# Patient Record
Sex: Female | Born: 1990 | Race: Black or African American | Hispanic: No | Marital: Single | Smoking: Never smoker
Health system: Southern US, Community
[De-identification: ages and names within clinical notes are randomized; demographics above are authoritative.]

## PROBLEM LIST (undated history)

## (undated) DIAGNOSIS — H6690 Otitis media, unspecified, unspecified ear: Secondary | ICD-10-CM

---
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
## 2010-04-03 ENCOUNTER — Emergency Department: Payer: Self-pay | Admitting: Emergency Medicine

## 2010-04-03 ENCOUNTER — Ambulatory Visit: Payer: Self-pay | Admitting: Family Medicine

## 2010-04-06 ENCOUNTER — Encounter: Payer: Self-pay | Admitting: Family Medicine

## 2010-08-10 ENCOUNTER — Ambulatory Visit: Payer: Self-pay | Admitting: Family Medicine

## 2010-09-21 ENCOUNTER — Ambulatory Visit: Payer: Self-pay | Admitting: Family Medicine

## 2010-09-28 ENCOUNTER — Ambulatory Visit: Payer: Self-pay | Admitting: Family Medicine

## 2011-03-18 ENCOUNTER — Ambulatory Visit: Payer: Self-pay | Admitting: Family Medicine

## 2011-07-12 ENCOUNTER — Ambulatory Visit: Payer: Self-pay | Admitting: Family Medicine

## 2011-07-27 ENCOUNTER — Ambulatory Visit: Payer: Self-pay | Admitting: Family Medicine

## 2011-08-20 ENCOUNTER — Ambulatory Visit: Payer: Self-pay | Admitting: Family Medicine

## 2011-09-14 ENCOUNTER — Ambulatory Visit: Payer: Self-pay | Admitting: Family Medicine

## 2011-11-05 ENCOUNTER — Ambulatory Visit: Payer: Self-pay | Admitting: Family Medicine

## 2012-09-29 IMAGING — CR DG SHOULDER 3+V*R*
1 series · 4 of 4 positions shown · non-contrast
Comparison: none

REASON FOR EXAM: chronic rt shoulder pain fax report to [DATE] Dr
Ilene Tobias
COMMENTS:

PROCEDURE:     DXR - DXR SHOULDER RIGHT COMPLETE  - July 12, 2011 [DATE]
RESULT:     No fracture, dislocation or other acute bony abnormality is
identified. No calcification about the humeral head is seen.

[Series 1: internal rotate · 0.17mm/px · 4 of 4 slices shown]
[im 1/4]
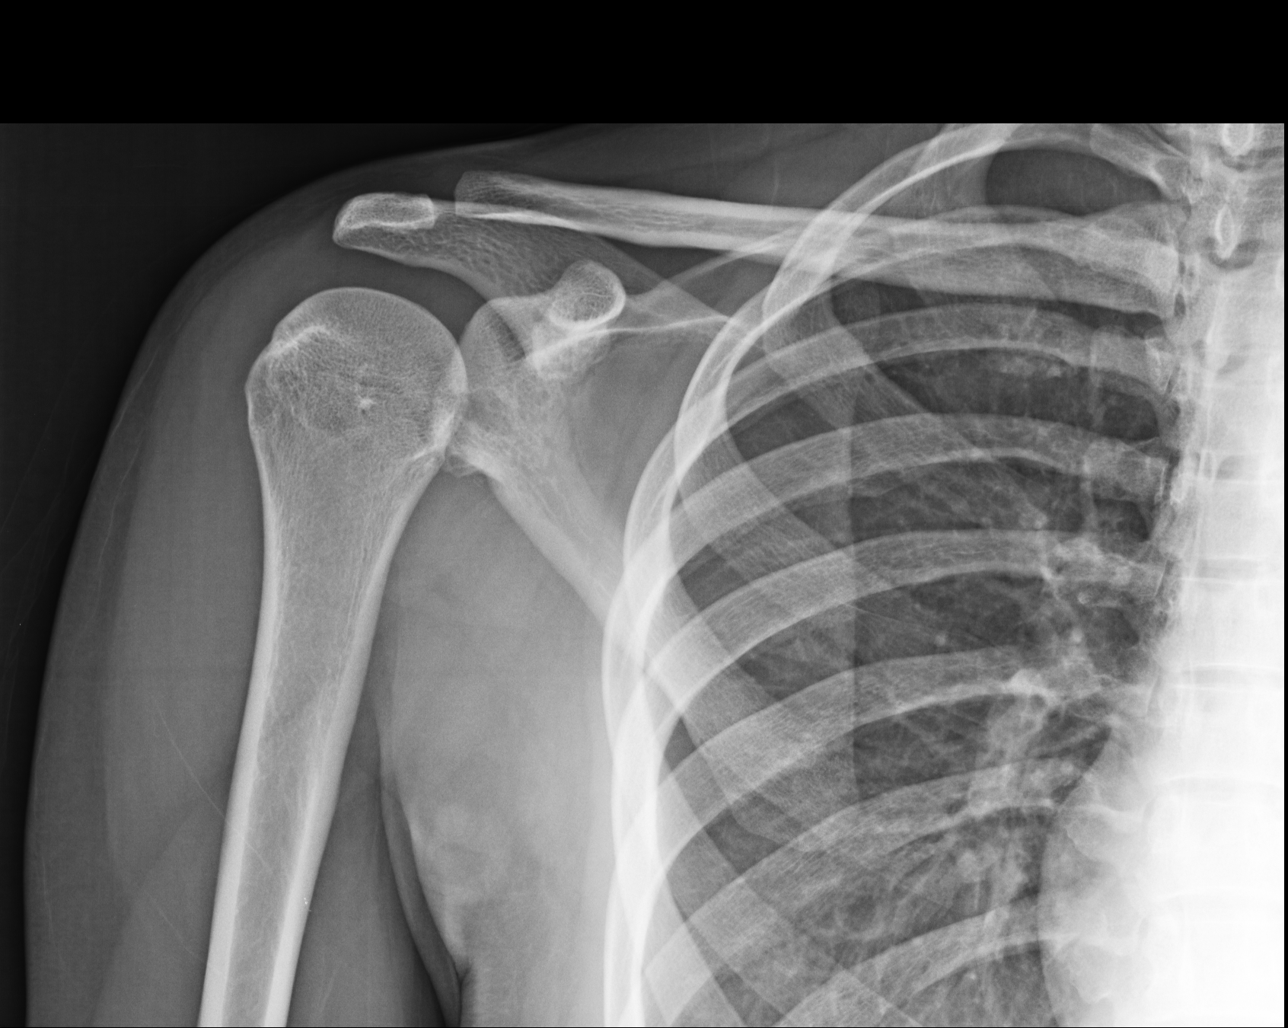
[im 2/4]
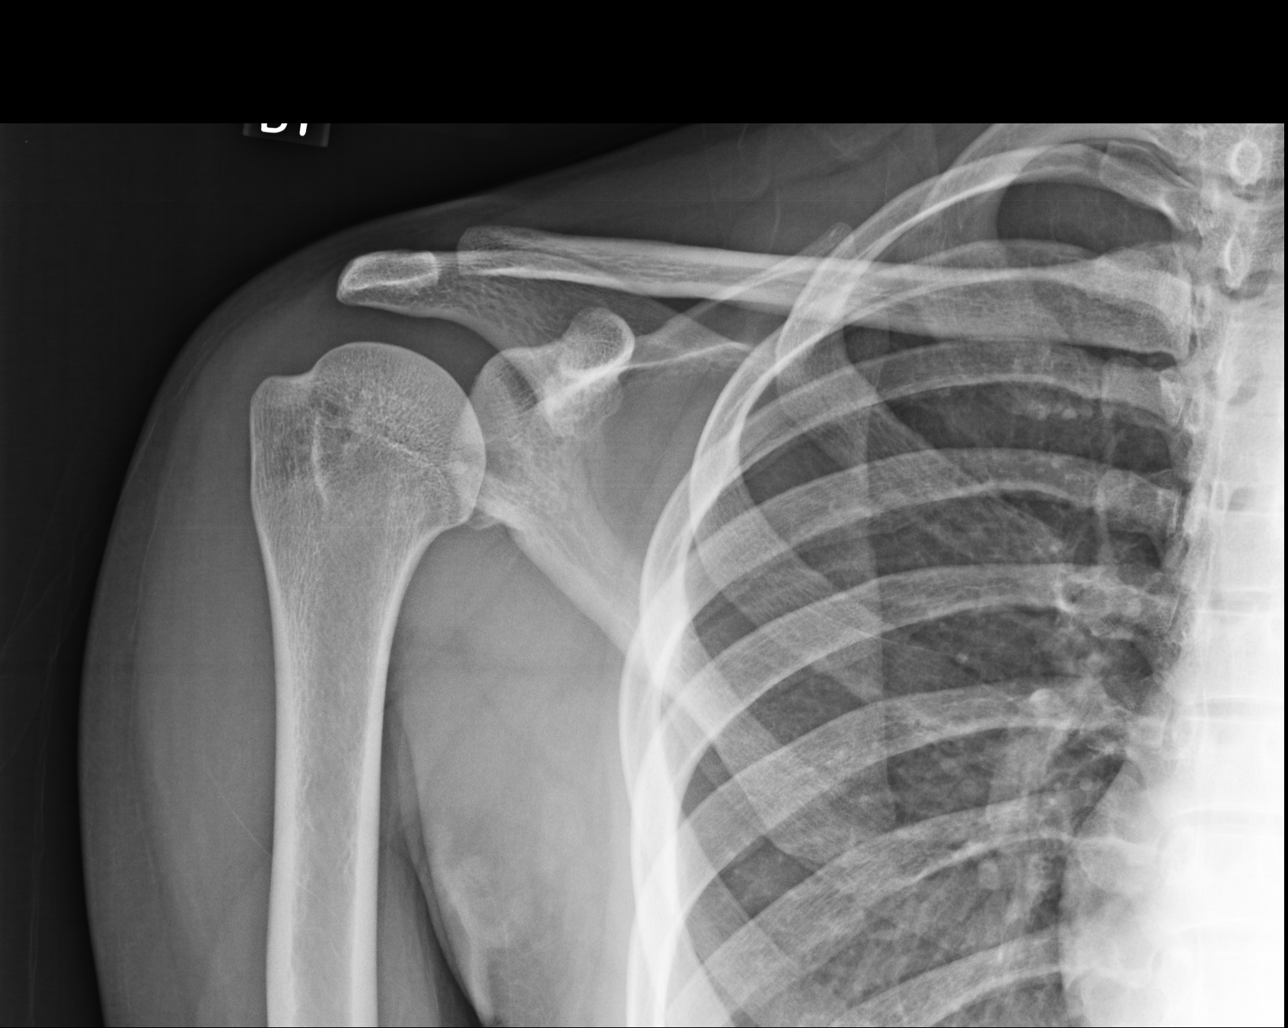
[im 3/4]
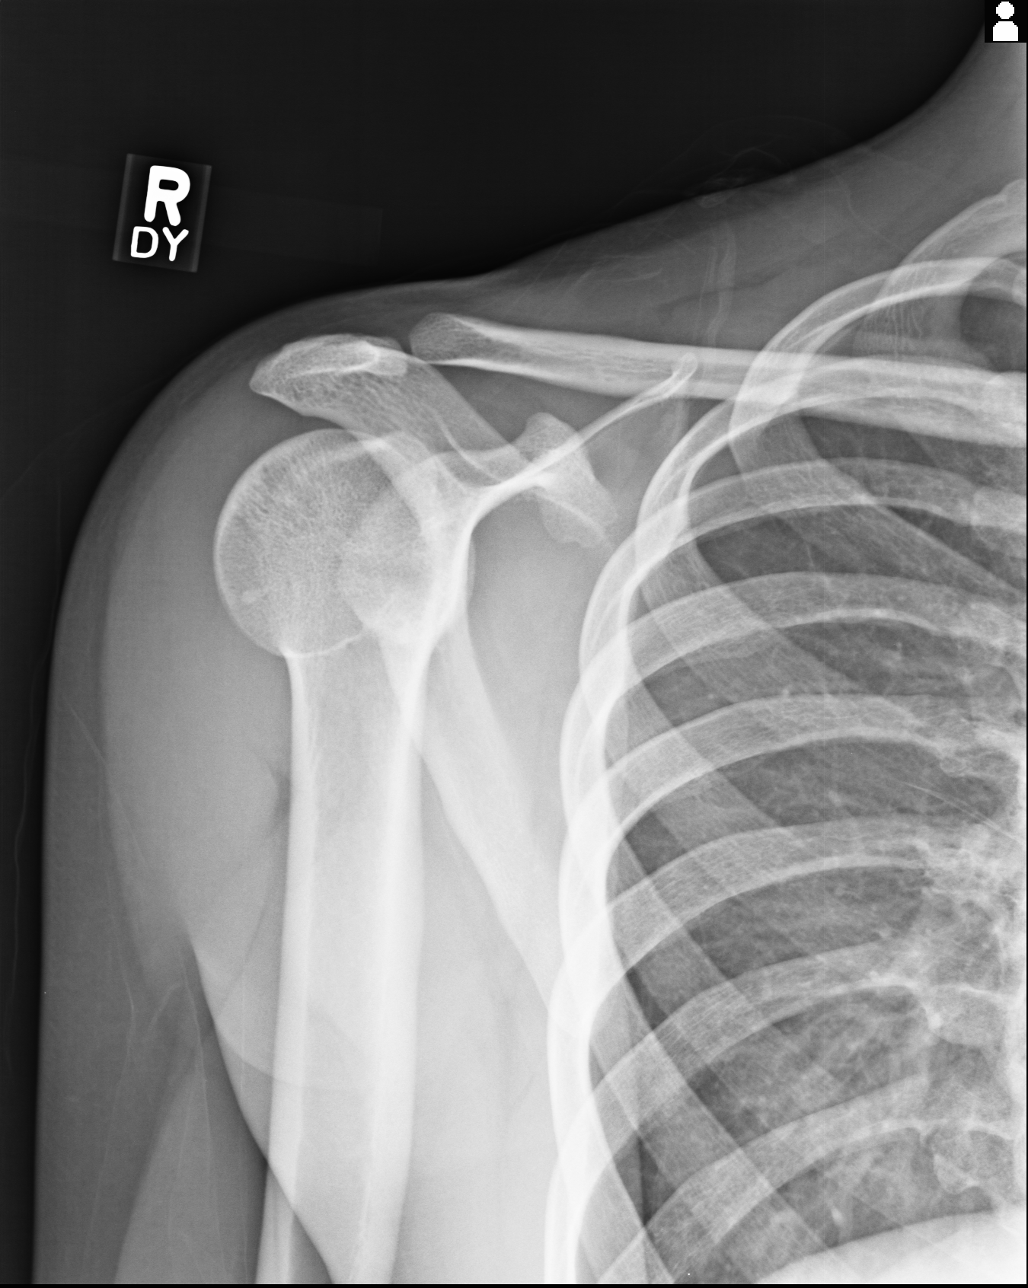
[im 4/4]
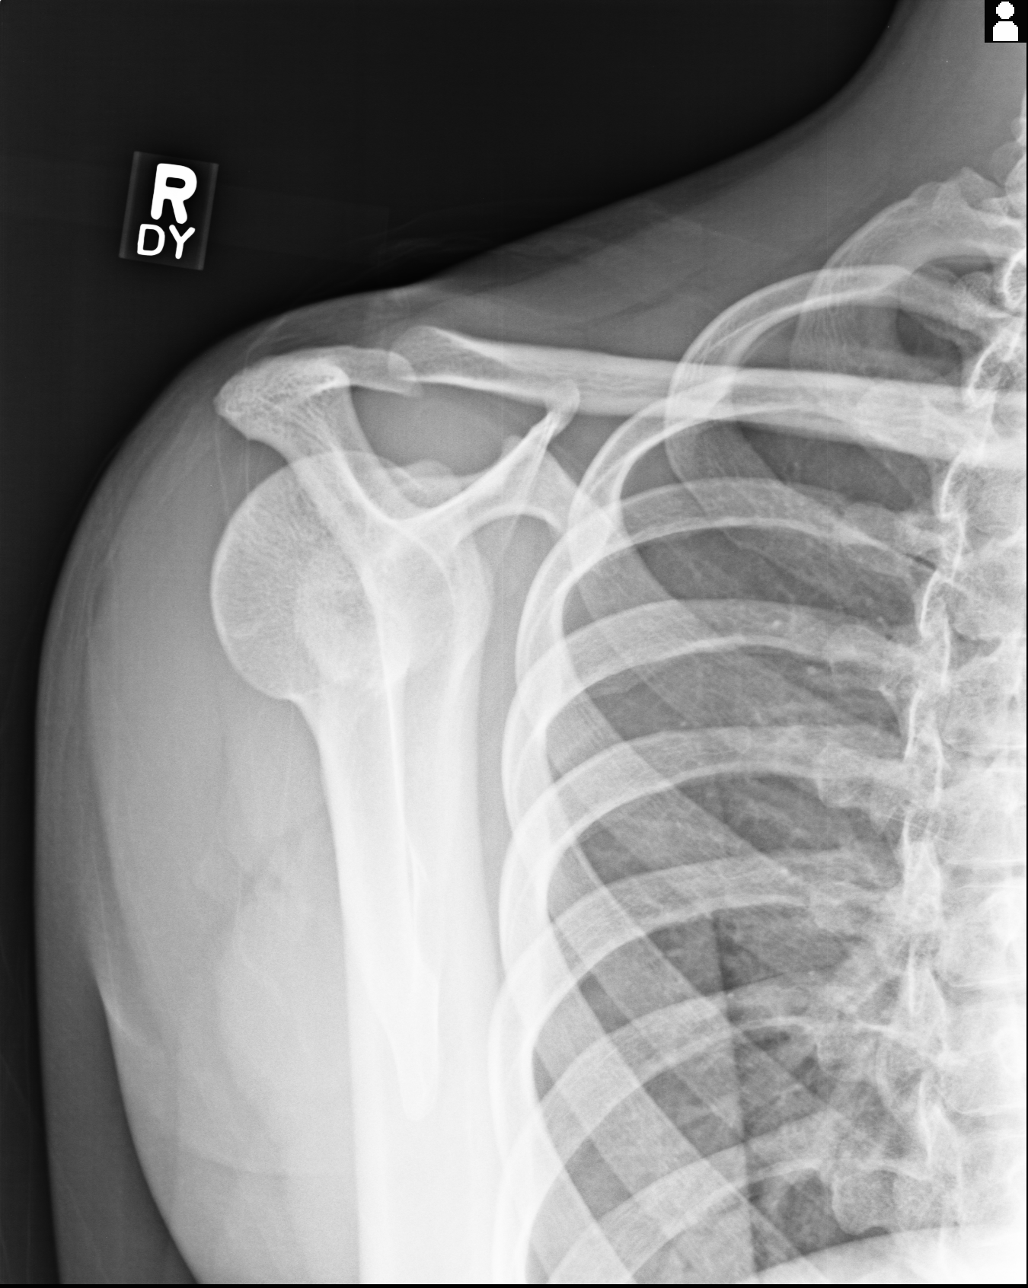

[4 of 4 positions shown; findings below may reference images not displayed]

IMPRESSION: No significant osseous abnormalities are noted.

## 2014-02-16 ENCOUNTER — Encounter (HOSPITAL_COMMUNITY): Payer: Self-pay | Admitting: Emergency Medicine

## 2014-02-16 ENCOUNTER — Emergency Department (INDEPENDENT_AMBULATORY_CARE_PROVIDER_SITE_OTHER)
Admission: EM | Admit: 2014-02-16 | Discharge: 2014-02-16 | Disposition: A | Discharge status: Home / Self Care | Payer: Self-pay | Source: Home / Self Care | Attending: Family Medicine | Admitting: Family Medicine

## 2014-02-16 DIAGNOSIS — H60399 Other infective otitis externa, unspecified ear: Secondary | ICD-10-CM

## 2014-02-16 DIAGNOSIS — H6093 Unspecified otitis externa, bilateral: Secondary | ICD-10-CM

## 2014-02-16 NOTE — Discharge Instructions (Signed)
Use ear drops 4 drops 3 times a day, return on tues.

## 2014-02-16 NOTE — ED Provider Notes (Signed)
CSN: [id number]     Arrival date & time 02/16/14  1231 History   First MD Initiated Contact with Patient 02/16/14 1234     Chief Complaint  Patient presents with  . Otalgia   (Consider location/radiation/quality/duration/timing/severity/associated sxs/prior Treatment) Patient is a 23 y.o. female presenting with ear pain. The history is provided by the patient.  Otalgia Location:  Bilateral Behind ear:  No abnormality Quality:  Sharp and throbbing Severity:  Moderate Onset quality:  Gradual Duration:  4 days Progression:  Unchanged Chronicity:  New Context: water   Context comment:  At beach in pool last week. Relieved by:  None tried Worsened by:  Nothing tried Associated symptoms: hearing loss   Associated symptoms: no ear discharge and no fever   Risk factors: recent travel     History reviewed. No pertinent past medical history. Past Surgical History  Procedure Laterality Date  . Shoulder surgery     History reviewed. No pertinent family history. History  Substance Use Topics  . Smoking status: Not on file  . Smokeless tobacco: Not on file  . Alcohol Use: Not on file   OB History   Grav Para Term Preterm Abortions TAB SAB Ect Mult Living                 Review of Systems  Constitutional: Negative.  Negative for fever.  HENT: Positive for ear pain and hearing loss. Negative for ear discharge and facial swelling.     Allergies  Review of patient's allergies indicates no known allergies.  Home Medications   Prior to Admission medications   Not on File   BP 127/80  Pulse 74  Temp(Src) 98.4 F (36.9 C) (Oral)  Resp 16  SpO2 100%  LMP 02/10/2014 Physical Exam  Nursing note and vitals reviewed. Constitutional: She is oriented to person, place, and time. She appears well-developed and well-nourished.  HENT:  Right Ear: There is swelling and tenderness. Decreased hearing is noted.  Left Ear: There is swelling and tenderness. Decreased hearing is noted.   Mouth/Throat: Oropharynx is clear and moist.  Neck: Normal range of motion. Neck supple.  Lymphadenopathy:    She has cervical adenopathy.  Neurological: She is alert and oriented to person, place, and time.  Skin: Skin is warm and dry.    ED Course  Procedures (including critical care time) Labs Review Labs Reviewed - No data to display  Imaging Review No results found.   MDM   1. Otitis externa of both ears    Ear wicks placed bilat and cortisporin instilled.    Linna HoffJames D Misk Galentine, MD 02/16/14 1318

## 2014-02-16 NOTE — ED Notes (Signed)
Pt  Reports    Symptoms   Of   Bilateral  Earache        [facility]   [facility]  Any other  Symptoms            Symptoms  Started  About  4  Days  Ago

## 2014-02-19 ENCOUNTER — Encounter (HOSPITAL_COMMUNITY): Payer: Self-pay | Admitting: Emergency Medicine

## 2014-02-19 ENCOUNTER — Emergency Department (HOSPITAL_COMMUNITY)
Admission: EM | Admit: 2014-02-19 | Discharge: 2014-02-19 | Disposition: A | Discharge status: Home / Self Care | Payer: Self-pay | Source: Home / Self Care | Attending: Family Medicine | Admitting: Family Medicine

## 2014-02-19 DIAGNOSIS — H6093 Unspecified otitis externa, bilateral: Secondary | ICD-10-CM

## 2014-02-19 DIAGNOSIS — H60399 Other infective otitis externa, unspecified ear: Secondary | ICD-10-CM

## 2014-02-19 HISTORY — DX: Otitis media, unspecified, unspecified ear: H66.90

## 2014-02-19 MED ORDER — CIPROFLOXACIN-HYDROCORTISONE 0.2-1 % OT SUSP: 4.0000 [drp] | Freq: Two times a day (BID) | OTIC | Status: DC

## 2014-02-19 MED ORDER — HYDROCODONE-ACETAMINOPHEN 5-325 MG PO TABS: 1.0000 | ORAL_TABLET | Freq: Four times a day (QID) | ORAL | Status: DC | PRN

## 2014-02-19 MED ORDER — CEPHALEXIN 500 MG PO CAPS: 500.0000 mg | ORAL_CAPSULE | Freq: Four times a day (QID) | ORAL | Status: DC

## 2014-02-19 NOTE — ED Notes (Signed)
Pt  Here for   For  A  Recheck        -   Pt       Had    Ear  Wick in  Place           And     Recently removed             Having  Pain in the r  Ear              Taking  Anti  Biotic  Drops  As  Directed

## 2014-02-19 NOTE — ED Provider Notes (Signed)
CSN: [id number]     Arrival date & time 02/19/14  0801 History   First MD Initiated Contact with Patient 02/19/14 0805     Chief Complaint  Patient presents with  . Otalgia   (Consider location/radiation/quality/duration/timing/severity/associated sxs/prior Treatment) Patient is a 23 y.o. female presenting with ear drainage. The history is provided by the patient.  Ear Drainage This is a recurrent (seen 8/15 , took wicks out last eve, , still with ear pain.) problem. The current episode started more than 2 days ago. The problem has not changed since onset.   Past Medical History  Diagnosis Date  . Ear infection    Past Surgical History  Procedure Laterality Date  . Shoulder surgery     History reviewed. No pertinent family history. History  Substance Use Topics  . Smoking status: Never Smoker   . Smokeless tobacco: Not on file  . Alcohol Use: Yes   OB History   Grav Para Term Preterm Abortions TAB SAB Ect Mult Living                 Review of Systems  Constitutional: Negative.   HENT: Positive for ear discharge and ear pain.     Allergies  Review of patient's allergies indicates no known allergies.  Home Medications   Prior to Admission medications   Medication Sig Start Date End Date Taking? Authorizing Provider  cephALEXin (KEFLEX) 500 MG capsule Take 1 capsule (500 mg total) by mouth 4 (four) times daily. Take all of medicine and drink lots of fluids 02/19/14   Linna HoffJames D Erie Radu, MD  ciprofloxacin-hydrocortisone ([facility]) otic suspension Place 4 drops into both ears 2 (two) times daily. 02/19/14   Linna HoffJames D Sincerity Cedar, MD  HYDROcodone-acetaminophen (NORCO/VICODIN) 5-325 MG per tablet Take 1 tablet by mouth every 6 (six) hours as needed for moderate pain or severe pain. 02/19/14   Linna HoffJames D Surah Pelley, MD  HYDROcodone-acetaminophen (NORCO/VICODIN) 5-325 MG per tablet Take 1 tablet by mouth every 6 (six) hours as needed for moderate pain or severe pain. 02/19/14   Linna HoffJames D Aneudy Champlain, MD    HYDROcodone-acetaminophen (NORCO/VICODIN) 5-325 MG per tablet Take 1 tablet by mouth every 6 (six) hours as needed for moderate pain or severe pain. 02/19/14   Linna HoffJames D Shawna Wearing, MD   BP 122/84  Pulse 83  Temp(Src) 98.5 F (36.9 C) (Oral)  Resp 14  SpO2 98%  LMP 02/10/2014 Physical Exam  Nursing note and vitals reviewed. Constitutional: She appears well-developed and well-nourished.  HENT:  Ears:  Mouth/Throat: Oropharynx is clear and moist.  Neck: Normal range of motion. Neck supple.  Lymphadenopathy:    She has no cervical adenopathy.    ED Course  Procedures (including critical care time) Labs Review Labs Reviewed - No data to display  Imaging Review No results found.   MDM   1. Otitis externa of both ears    Exudate removed with irrig bilat, canals still swollen. Ear wicks replaced.    Linna HoffJames D Dawson Albers, MD 02/19/14 [id number]

## 2014-02-19 NOTE — Discharge Instructions (Signed)
Continue new medicine as prescribed, return on fri for recheck.use heat also for comfort.

## 2014-02-22 ENCOUNTER — Encounter (HOSPITAL_COMMUNITY): Payer: Self-pay | Admitting: Emergency Medicine

## 2014-02-22 ENCOUNTER — Emergency Department (INDEPENDENT_AMBULATORY_CARE_PROVIDER_SITE_OTHER)
Admission: EM | Admit: 2014-02-22 | Discharge: 2014-02-22 | Disposition: A | Discharge status: Home / Self Care | Payer: Self-pay | Source: Home / Self Care | Attending: Family Medicine | Admitting: Family Medicine

## 2014-02-22 DIAGNOSIS — H60399 Other infective otitis externa, unspecified ear: Secondary | ICD-10-CM

## 2014-02-22 DIAGNOSIS — H6093 Unspecified otitis externa, bilateral: Secondary | ICD-10-CM

## 2014-02-22 NOTE — Discharge Instructions (Signed)
Go to dr Ezzard Standingnewman at 2;15 today.

## 2014-02-22 NOTE — ED Provider Notes (Signed)
CSN: [id number]     Arrival date & time 02/22/14  0802 History   First MD Initiated Contact with Patient 02/22/14 0831     Chief Complaint  Patient presents with  . Follow-up   (Consider location/radiation/quality/duration/timing/severity/associated sxs/prior Treatment) Patient is a 23 y.o. female presenting with ear pain. The history is provided by the patient.  Otalgia Location:  Bilateral Behind ear:  No abnormality Quality:  Sharp Severity:  Moderate Onset quality:  Gradual Progression:  Unchanged Chronicity:  New Context comment:  SEEN8/15 AND 8/18 FOR BILAT OE, HAD WICKS PLACED,  taking drops and abx prescribed, self removed left wick, right still in, right still tender, left, improved. Associated symptoms: no ear discharge and no fever     Past Medical History  Diagnosis Date  . Ear infection    Past Surgical History  Procedure Laterality Date  . Shoulder surgery     History reviewed. No pertinent family history. History  Substance Use Topics  . Smoking status: Never Smoker   . Smokeless tobacco: Not on file  . Alcohol Use: Yes   OB History   Grav Para Term Preterm Abortions TAB SAB Ect Mult Living                 Review of Systems  Constitutional: Negative.  Negative for fever.  HENT: Positive for ear pain. Negative for ear discharge and facial swelling.     Allergies  Review of patient's allergies indicates no known allergies.  Home Medications   Prior to Admission medications   Medication Sig Start Date End Date Taking? Authorizing Provider  cephALEXin (KEFLEX) 500 MG capsule Take 1 capsule (500 mg total) by mouth 4 (four) times daily. Take all of medicine and drink lots of fluids 02/19/14   Linna HoffJames D Jowel Waltner, MD  ciprofloxacin-hydrocortisone ([facility]) otic suspension Place 4 drops into both ears 2 (two) times daily. 02/19/14   Linna HoffJames D Kiosha Buchan, MD  HYDROcodone-acetaminophen (NORCO/VICODIN) 5-325 MG per tablet Take 1 tablet by mouth every 6 (six) hours as  needed for moderate pain or severe pain. 02/19/14   Linna HoffJames D Anjuli Gemmill, MD  HYDROcodone-acetaminophen (NORCO/VICODIN) 5-325 MG per tablet Take 1 tablet by mouth every 6 (six) hours as needed for moderate pain or severe pain. 02/19/14   Linna HoffJames D Sherill Mangen, MD  HYDROcodone-acetaminophen (NORCO/VICODIN) 5-325 MG per tablet Take 1 tablet by mouth every 6 (six) hours as needed for moderate pain or severe pain. 02/19/14   Linna HoffJames D Tanicia Wolaver, MD   BP 125/77  Pulse 75  Temp(Src) 98.7 F (37.1 C) (Oral)  Resp 14  SpO2 97%  LMP 02/10/2014 Physical Exam  Nursing note and vitals reviewed. Constitutional: She is oriented to person, place, and time. She appears well-developed and well-nourished. She appears distressed.  HENT:  Head: Normocephalic.  Improved left canal, reduced swelling, min canal debris. Right continues tender and swollen, ear wick in canal.  Neck: Normal range of motion. Neck supple.  Lymphadenopathy:    She has cervical adenopathy.  Neurological: She is alert and oriented to person, place, and time.  Skin: Skin is warm and dry.    ED Course  Procedures (including critical care time) Labs Review Labs Reviewed - No data to display  Imaging Review No results found.   MDM   1. Otitis externa of both ears    Unable to remove right wick with irrig or forceps-wick disintegrates,. Pt with mod pain.    Linna HoffJames D Jaspreet Bodner, MD 02/22/14 [phone]

## 2014-02-22 NOTE — ED Notes (Signed)
Pt  Reports   She  Is  Here  For      A  Recheck        Of her  Ears         r  Ear  Remains  painfull  To  The  Touch       Pt  Reports  Has  Been applying  Drops  As  Directed

## 2014-03-04 ENCOUNTER — Encounter (HOSPITAL_COMMUNITY): Payer: Self-pay | Admitting: Emergency Medicine

## 2014-03-04 ENCOUNTER — Emergency Department (INDEPENDENT_AMBULATORY_CARE_PROVIDER_SITE_OTHER)
Admission: EM | Admit: 2014-03-04 | Discharge: 2014-03-04 | Disposition: A | Discharge status: Home / Self Care | Payer: Self-pay | Source: Home / Self Care | Attending: Family Medicine | Admitting: Family Medicine

## 2014-03-04 DIAGNOSIS — B3731 Acute candidiasis of vulva and vagina: Secondary | ICD-10-CM

## 2014-03-04 DIAGNOSIS — B373 Candidiasis of vulva and vagina: Secondary | ICD-10-CM

## 2014-03-04 LAB — POCT PREGNANCY, URINE: PREG TEST UR: NEGATIVE

## 2014-03-04 MED ORDER — TERCONAZOLE 80 MG VA SUPP
80.0000 mg | Freq: Every day | VAGINAL | Status: DC
Start: 2014-03-04 — End: 2014-03-24

## 2014-03-04 MED ORDER — FLUCONAZOLE 150 MG PO TABS: 150.0000 mg | ORAL_TABLET | Freq: Once | ORAL | Status: DC

## 2014-03-04 NOTE — ED Provider Notes (Signed)
CSN: [id number]     Arrival date & time 03/04/14  1640 History   First MD Initiated Contact with Patient 03/04/14 1655     Chief Complaint  Patient presents with  . Vaginitis   (Consider location/radiation/quality/duration/timing/severity/associated sxs/prior Treatment) Patient is a 23 y.o. female presenting with vaginal itching. The history is provided by the patient.  Vaginal Itching This is a new problem. The current episode started more than 1 week ago. The problem has been gradually worsening. Associated symptoms comments: Recent abx for OE, treated at [facility] and by dr newman-ent, vag d/c started today. .    Past Medical History  Diagnosis Date  . Ear infection    Past Surgical History  Procedure Laterality Date  . Shoulder surgery Right 2010   Family History  Problem Relation Age of Onset  . Stroke Father    History  Substance Use Topics  . Smoking status: Never Smoker   . Smokeless tobacco: Not on file  . Alcohol Use: Yes     Comment: occasional   OB History   Grav Para Term Preterm Abortions TAB SAB Ect Mult Living                 Review of Systems  Constitutional: Negative.   Genitourinary: Positive for vaginal discharge.    Allergies  Review of patient's allergies indicates no known allergies.  Home Medications   Prior to Admission medications   Medication Sig Start Date End Date Taking? Authorizing Provider  cephALEXin (KEFLEX) 500 MG capsule Take 1 capsule (500 mg total) by mouth 4 (four) times daily. Take all of medicine and drink lots of fluids 02/19/14   Linna Hoff, MD  ciprofloxacin-hydrocortisone (CIPRO [facility]) otic suspension Place 4 drops into both ears 2 (two) times daily. 02/19/14   Linna Hoff, MD  fluconazole (DIFLUCAN) 150 MG tablet Take 1 tablet (150 mg total) by mouth once. Repeat in 1 week 03/04/14   Linna Hoff, MD  HYDROcodone-acetaminophen (NORCO/VICODIN) 5-325 MG per tablet Take 1 tablet by mouth every 6 (six) hours as needed for  moderate pain or severe pain. 02/19/14   Linna Hoff, MD  HYDROcodone-acetaminophen (NORCO/VICODIN) 5-325 MG per tablet Take 1 tablet by mouth every 6 (six) hours as needed for moderate pain or severe pain. 02/19/14   Linna Hoff, MD  HYDROcodone-acetaminophen (NORCO/VICODIN) 5-325 MG per tablet Take 1 tablet by mouth every 6 (six) hours as needed for moderate pain or severe pain. 02/19/14   Linna Hoff, MD  terconazole (TERAZOL 3) 80 MG vaginal suppository Place 1 suppository (80 mg total) vaginally at bedtime. 03/04/14   Linna Hoff, MD   BP 118/76  Pulse 72  Temp(Src) 98.7 F (37.1 C) (Oral)  Resp 14  SpO2 100%  LMP 02/08/2014 Physical Exam  Nursing note and vitals reviewed. Constitutional: She is oriented to person, place, and time. She appears well-developed and well-nourished. No distress.  HENT:  Right Ear: External ear normal.  Left Ear: External ear normal.  Purple d/c in left canal, soft exudate in right canal.  Neck: Normal range of motion. Neck supple.  Abdominal: There is no tenderness.  Lymphadenopathy:    She has no cervical adenopathy.  Neurological: She is alert and oriented to person, place, and time.  Skin: Skin is warm and dry.    ED Course  Procedures (including critical care time) Labs Review Labs Reviewed  POCT PREGNANCY, URINE    Imaging Review No results found.  MDM   1. Candida vaginitis       Linna Hoff, MD 03/04/14 1714

## 2014-03-04 NOTE — ED Notes (Signed)
Here 8/15, 8/18 and 8/21 with infections in both her ears.  She was treated with Keflex, Cipro otic suspension and Hydrocodone.  She developed a yeast infection.  Vaginal irritation x 1 week and white clumpy discharge started today.  No pain in her ears.

## 2014-03-05 LAB — POCT URINALYSIS DIP (DEVICE)
Bilirubin Urine: NEGATIVE
Glucose, UA: NEGATIVE mg/dL
KETONES UR: NEGATIVE mg/dL
Leukocytes, UA: NEGATIVE
NITRITE: NEGATIVE
Protein, ur: 30 mg/dL — AB
Specific Gravity, Urine: >1.03 — ABNORMAL HIGH (ref 1.005–1.030)
Urobilinogen, UA: 0.2 mg/dL (ref 0.0–1.0)
pH: 5.5 (ref 5.0–8.0)

## 2014-03-24 ENCOUNTER — Emergency Department (INDEPENDENT_AMBULATORY_CARE_PROVIDER_SITE_OTHER)
Admission: EM | Admit: 2014-03-24 | Discharge: 2014-03-24 | Disposition: A | Discharge status: Home / Self Care | Payer: Self-pay | Source: Home / Self Care

## 2014-03-24 DIAGNOSIS — J020 Streptococcal pharyngitis: Secondary | ICD-10-CM

## 2014-03-24 LAB — POCT RAPID STREP A: STREPTOCOCCUS, GROUP A SCREEN (DIRECT): POSITIVE — AB

## 2014-03-24 MED ORDER — MAGIC MOUTHWASH W/LIDOCAINE: 5.0000 mL | Freq: Four times a day (QID) | ORAL | Status: AC | PRN

## 2014-03-24 MED ORDER — AMOXICILLIN 500 MG PO CAPS: 500.0000 mg | ORAL_CAPSULE | Freq: Three times a day (TID) | ORAL | Status: AC

## 2014-03-24 MED ORDER — ALIGN 4 MG PO CAPS: 1.0000 | ORAL_CAPSULE | Freq: Every day | ORAL | Status: AC

## 2014-03-24 NOTE — ED Provider Notes (Signed)
CSN: [id number]     Arrival date & time 03/24/14  1242 History   None    Chief Complaint  Patient presents with  . Sore Throat   (Consider location/radiation/quality/duration/timing/severity/associated sxs/prior Treatment)  HPI  Patient is a 23 year old female presenting today with complaints of a sore throat onset this morning. Patient states she was having generalized malaise with a fever of a high of 102 yesterday.  Patient has been treated for a "double ear infection". She is seeing Dr. Ezzard Standing ENT.  The patient states history of frequent strep. States last treated greater than 6 months ago.   Past Medical History  Diagnosis Date  . Ear infection    Past Surgical History  Procedure Laterality Date  . Shoulder surgery Right 2010   Family History  Problem Relation Age of Onset  . Stroke Father    History  Substance Use Topics  . Smoking status: Never Smoker   . Smokeless tobacco: Not on file  . Alcohol Use: Yes     Comment: occasional   OB History   Grav Para Term Preterm Abortions TAB SAB Ect Mult Living                 Review of Systems  Constitutional: Positive for fever and fatigue. Negative for chills.  HENT: Positive for sore throat. Negative for congestion, mouth sores and voice change.   Eyes: Negative.   Cardiovascular: Negative.  Negative for chest pain.  Gastrointestinal: Negative.  Negative for nausea, vomiting and diarrhea.  Endocrine: Negative.   Genitourinary: Negative.   Musculoskeletal: Negative.  Negative for myalgias.  Skin: Negative.  Negative for rash.  Allergic/Immunologic: Negative.   Neurological: Negative.  Negative for dizziness and weakness.  Hematological: Negative.   Psychiatric/Behavioral: Negative.     Allergies  Review of patient's allergies indicates no known allergies.  Home Medications   Prior to Admission medications   Medication Sig Start Date End Date Taking? Authorizing Provider  Alum & Mag Hydroxide-Simeth (MAGIC  MOUTHWASH W/LIDOCAINE) SOLN Take 5 mLs by mouth 4 (four) times daily as needed for mouth pain (Swish, gargle, and spit one to 2 teaspoonfuls every 6 hours as needed. May be swallowed if desired.). 03/24/14   Servando Salina, NP  amoxicillin (AMOXIL) 500 MG capsule Take 1 capsule (500 mg total) by mouth 3 (three) times daily. 03/24/14   Servando Salina, NP  Probiotic Product (ALIGN) 4 MG CAPS Take 1 capsule (4 mg total) by mouth daily. 03/24/14   Servando Salina, NP   BP 124/82  Pulse 82  Temp(Src) 99 F (37.2 C) (Oral)  Resp 20  SpO2 96%  LMP 02/08/2014  Physical Exam  Nursing note and vitals reviewed. Constitutional: She is oriented to person, place, and time. She appears well-developed and well-nourished. No distress.  HENT:  Head: Normocephalic and atraumatic.  Bilateral tympanic membranes pearly gray in appearance with light reflexes present and bony prominences visualized.  Patient's left ear canal is dyed purple in appearance. She states that a dye was placed in her ear canal by ENT a few weeks ago. Patient to follow up on this in another 2 weeks.   Neck: Normal range of motion. Neck supple.  Mild anterior cervical lymphadenopathy present bilaterally. Negative for nuchal rigidity.  Cardiovascular: Normal rate, regular rhythm, normal heart sounds and intact distal pulses.  Exam reveals no gallop and no friction rub.   No murmur heard. Pulmonary/Chest: Effort normal and breath sounds normal. No respiratory distress. She has  no wheezes. She has no rales. She exhibits no tenderness.  Lymphadenopathy:    She has cervical adenopathy.  Neurological: She is alert and oriented to person, place, and time.  Skin: Skin is warm and dry. No rash noted. She is not diaphoretic.    ED Course  Procedures (including critical care time) Labs Review Labs Reviewed  POCT RAPID STREP A ([facility] ONLY) - Abnormal; Notable for the following:    Streptococcus, Group A Screen (Direct) POSITIVE (*)     All other components within normal limits    Imaging Review No results found.   MDM   1. Strep pharyngitis    Meds ordered this encounter  Medications  . Probiotic Product (ALIGN) 4 MG CAPS    Sig: Take 1 capsule (4 mg total) by mouth daily.    Dispense:  30 capsule    Refill:  2  . Alum & Mag Hydroxide-Simeth (MAGIC MOUTHWASH W/LIDOCAINE) SOLN    Sig: Take 5 mLs by mouth 4 (four) times daily as needed for mouth pain (Swish, gargle, and spit one to 2 teaspoonfuls every 6 hours as needed. May be swallowed if desired.).    Dispense:  120 mL    Refill:  0  . amoxicillin (AMOXIL) 500 MG capsule    Sig: Take 1 capsule (500 mg total) by mouth 3 (three) times daily.    Dispense:  30 capsule    Refill:  0   The patient verbalizes understanding and agrees to plan of care.       Servando Salina, NP 03/24/14 1357

## 2014-03-24 NOTE — Discharge Instructions (Signed)

## 2014-03-24 NOTE — ED Notes (Signed)
Patient c/o sore throat x 2 days, hurts to swallow and states she had a fever of 102 yesterday.

## 2014-03-26 NOTE — ED Provider Notes (Signed)
Medical screening examination/treatment/procedure(s) were performed by a resident physician or non-physician practitioner and as the supervising physician I was immediately available for consultation/collaboration.  Evan Corey, MD    Evan S Corey, MD 03/26/14 0748
